# Patient Record
Sex: Female | Born: 1960 | Race: White | Hispanic: No | Marital: Single | Smoking: Current every day smoker
Health system: Southern US, Community
[De-identification: ages and names within clinical notes are randomized; demographics above are authoritative.]

## PROBLEM LIST (undated history)

## (undated) DIAGNOSIS — F17200 Nicotine dependence, unspecified, uncomplicated: Secondary | ICD-10-CM

## (undated) DIAGNOSIS — F319 Bipolar disorder, unspecified: Secondary | ICD-10-CM

## (undated) DIAGNOSIS — F419 Anxiety disorder, unspecified: Secondary | ICD-10-CM

## (undated) DIAGNOSIS — I10 Essential (primary) hypertension: Secondary | ICD-10-CM

## (undated) DIAGNOSIS — J449 Chronic obstructive pulmonary disease, unspecified: Secondary | ICD-10-CM

## (undated) HISTORY — PX: FRACTURE SURGERY: SHX138

## (undated) HISTORY — PX: CERVICAL SPINE SURGERY: SHX589

## (undated) HISTORY — PX: CARPAL TUNNEL RELEASE: SHX101

---

## 2016-08-01 ENCOUNTER — Encounter (HOSPITAL_COMMUNITY): Payer: Self-pay

## 2016-08-01 ENCOUNTER — Emergency Department (HOSPITAL_COMMUNITY)
Admission: EM | Admit: 2016-08-01 | Discharge: 2016-08-01 | Disposition: A | Discharge status: Home / Self Care | Payer: Medicare Other | Attending: Emergency Medicine | Admitting: Emergency Medicine

## 2016-08-01 ENCOUNTER — Emergency Department (HOSPITAL_COMMUNITY): Payer: Medicare Other

## 2016-08-01 DIAGNOSIS — Y9301 Activity, walking, marching and hiking: Secondary | ICD-10-CM | POA: Insufficient documentation

## 2016-08-01 DIAGNOSIS — S8991XA Unspecified injury of right lower leg, initial encounter: Secondary | ICD-10-CM | POA: Diagnosis present

## 2016-08-01 DIAGNOSIS — F17200 Nicotine dependence, unspecified, uncomplicated: Secondary | ICD-10-CM | POA: Diagnosis not present

## 2016-08-01 DIAGNOSIS — Y9289 Other specified places as the place of occurrence of the external cause: Secondary | ICD-10-CM | POA: Insufficient documentation

## 2016-08-01 DIAGNOSIS — Y999 Unspecified external cause status: Secondary | ICD-10-CM | POA: Insufficient documentation

## 2016-08-01 DIAGNOSIS — S8001XA Contusion of right knee, initial encounter: Secondary | ICD-10-CM

## 2016-08-01 DIAGNOSIS — W01198A Fall on same level from slipping, tripping and stumbling with subsequent striking against other object, initial encounter: Secondary | ICD-10-CM | POA: Insufficient documentation

## 2016-08-01 DIAGNOSIS — I10 Essential (primary) hypertension: Secondary | ICD-10-CM | POA: Insufficient documentation

## 2016-08-01 HISTORY — DX: Essential (primary) hypertension: I10

## 2016-08-01 NOTE — ED Triage Notes (Signed)
Patient complains of right leg pain after falling into metal pot last night, no loc. Complains of severe pain with ambulation. Patient with low grade fever and HR 130

## 2016-08-01 NOTE — ED Notes (Signed)
Patient advised she had been drinking coffee on the way to the hospital and during triage assessment. RN recheck temp 99.0 MD Ayesha Rumpf made aware patient had been drinking coffee. Advised to  D/c all orders but knee X-ray.

## 2016-08-01 NOTE — ED Notes (Signed)
Patient family given a coke to drink.

## 2016-08-01 NOTE — ED Provider Notes (Signed)
[facility] DEPT Provider Note   CSN: [id number] Arrival date & time: 08/01/16  1106     History   Chief Complaint Chief Complaint  Patient presents with  . Knee Injury    HPI Stacey Werner is a 55 y.o. female.  The history is provided by the patient. No language interpreter was used.   Stacey Werner is a 55 y.o. female who presents to the Emergency Department complaining of knee pain.  Last night she was walking in the dark by a campfire drinking beer when she tripped and fell, striking her right knee on a concert pot. She reports pain of the right knee and difficulty ambulating secondary to pain. She denies any chest pain, shortness breath, dysuria, fevers, nausea, vomiting, malaise. She was recently admitted to [facility] for stress testing a week ago.  Past Medical History:  Diagnosis Date  . Hypertension     There are no active problems to display for this patient.   History reviewed. No pertinent surgical history.  OB History    No data available       Home Medications    Prior to Admission medications   Medication Sig Start Date End Date Taking? Authorizing Provider  albuterol (PROVENTIL HFA;VENTOLIN HFA) 108 (90 Base) MCG/ACT inhaler Inhale 2 puffs into the lungs every 4 (four) hours as needed for wheezing or shortness of breath.    Yes Historical Provider, MD  ALPRAZolam Duanne Moron) 1 MG tablet Take 1 mg by mouth daily as needed for anxiety.   Yes Historical Provider, MD  busPIRone (BUSPAR) 15 MG tablet Take 15 mg by mouth 3 (three) times daily.   Yes Historical Provider, MD  DULoxetine (CYMBALTA) 60 MG capsule Take 60 mg by mouth daily. 05/13/16  Yes Historical Provider, MD  ergocalciferol (VITAMIN D2) 50000 units capsule Take 50,000 Units by mouth every Monday.   Yes Historical Provider, MD  gabapentin (NEURONTIN) 300 MG capsule Take 300 mg by mouth 2 (two) times daily.   Yes Historical Provider, MD  lisinopril (PRINIVIL,ZESTRIL) 10 MG tablet Take 10 mg by mouth  daily.   Yes Historical Provider, MD  methocarbamol (ROBAXIN) 750 MG tablet Take 750 mg by mouth 2 (two) times daily.   Yes Historical Provider, MD    Family History No family history on file.  Social History Social History  Substance Use Topics  . Smoking status: Current Every Day Smoker  . Smokeless tobacco: Never Used  . Alcohol use Not on file     Allergies   Review of patient's allergies indicates no known allergies.   Review of Systems Review of Systems  All other systems reviewed and are negative.    Physical Exam Updated Vital Signs BP 102/79   Pulse 110   Temp 99 F (37.2 C) (Oral)   Resp 20   SpO2 98%   Physical Exam  Constitutional: She is oriented to person, place, and time. She appears well-developed and well-nourished.  HENT:  Head: Normocephalic and atraumatic.  Cardiovascular: Regular rhythm.   No murmur heard. Tachycardic  Pulmonary/Chest: Effort normal and breath sounds normal. No respiratory distress.  Abdominal: Soft. There is no tenderness. There is no rebound and no guarding.  Musculoskeletal:  Small area of erythema to the right medial aspect of the knee. There is no significant edema. There is mild tenderness to the medial right knee. Flexion/extension are intact in the knee. 2+ DP pulses.  Neurological: She is alert and oriented to person, place, and time.  Skin: Skin  is warm and dry.  Psychiatric: Her behavior is normal.  Anxious  Nursing note and vitals reviewed.    ED Treatments / Results  Labs (all labs ordered are listed, but only abnormal results are displayed) Labs Reviewed - No data to display  EKG  EKG Interpretation None       Radiology Dg Knee Complete 4 Views Right  Result Date: 08/01/2016 CLINICAL DATA:  Fall with medial right knee pain. Initial encounter. EXAM: RIGHT KNEE - COMPLETE 4+ VIEW COMPARISON:  None. FINDINGS: No evidence of fracture, dislocation, or joint effusion. No evidence of arthropathy or other  focal bone abnormality. Soft tissues are unremarkable. IMPRESSION: Negative. Electronically Signed   By: Aletta Edouard M.D.   On: 08/01/2016 12:07    Procedures Procedures (including critical care time)  Medications Ordered in ED Medications - No data to display   Initial Impression / Assessment and Plan / ED Course  I have reviewed the triage vital signs and the nursing notes.  Pertinent labs & imaging results that were available during my care of the patient were reviewed by me and considered in my medical decision making (see chart for details).  Clinical Course    Patient here for evaluation of injuries following a mechanical fall when she was drinking last night. Her triage temperature was 100.3 with tachycardia. She has been drinking coffee drinker triage process on repeat assessment she is noted to not be febrile with temperature 99 and she has no infectious symptoms at this time. In terms of knee pain she has local tenderness with a small patch of erythema that is consistent with contusion and no evidence of acute infectious process at this time. Plain films are negative for fracture dislocation. She is well perfused. Discussed with patient home care for knee contusion with crutches for weightbearing as tolerated, Tylenol or ibuprofen for pain, outpatient follow-up and return precautions.  Final Clinical Impressions(s) / ED Diagnoses   Final diagnoses:  Knee contusion, right, initial encounter    New Prescriptions Discharge Medication List as of 08/01/2016 12:35 PM       Quintella Reichert, MD 08/01/16 [phone]

## 2017-10-16 ENCOUNTER — Ambulatory Visit: Payer: Self-pay | Admitting: Surgery
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
## 2017-10-24 ENCOUNTER — Other Ambulatory Visit: Payer: Self-pay

## 2017-10-24 ENCOUNTER — Encounter (HOSPITAL_BASED_OUTPATIENT_CLINIC_OR_DEPARTMENT_OTHER): Payer: Self-pay | Admitting: *Deleted

## 2017-10-25 ENCOUNTER — Encounter (HOSPITAL_BASED_OUTPATIENT_CLINIC_OR_DEPARTMENT_OTHER)
Admission: RE | Admit: 2017-10-25 | Discharge: 2017-10-25 | Disposition: A | Discharge status: Home / Self Care | Payer: Medicare Other | Source: Ambulatory Visit | Attending: Surgery | Admitting: Surgery

## 2017-10-25 DIAGNOSIS — Z881 Allergy status to other antibiotic agents status: Secondary | ICD-10-CM | POA: Diagnosis not present

## 2017-10-25 DIAGNOSIS — Z01812 Encounter for preprocedural laboratory examination: Secondary | ICD-10-CM | POA: Diagnosis present

## 2017-10-25 DIAGNOSIS — I10 Essential (primary) hypertension: Secondary | ICD-10-CM | POA: Diagnosis not present

## 2017-10-25 DIAGNOSIS — Z79899 Other long term (current) drug therapy: Secondary | ICD-10-CM | POA: Diagnosis not present

## 2017-10-25 DIAGNOSIS — D1720 Benign lipomatous neoplasm of skin and subcutaneous tissue of unspecified limb: Secondary | ICD-10-CM | POA: Diagnosis not present

## 2017-10-25 DIAGNOSIS — D1722 Benign lipomatous neoplasm of skin and subcutaneous tissue of left arm: Secondary | ICD-10-CM | POA: Diagnosis present

## 2017-10-25 DIAGNOSIS — J449 Chronic obstructive pulmonary disease, unspecified: Secondary | ICD-10-CM | POA: Diagnosis not present

## 2017-10-25 DIAGNOSIS — F419 Anxiety disorder, unspecified: Secondary | ICD-10-CM | POA: Diagnosis not present

## 2017-10-25 DIAGNOSIS — F319 Bipolar disorder, unspecified: Secondary | ICD-10-CM | POA: Diagnosis not present

## 2017-10-25 DIAGNOSIS — F17200 Nicotine dependence, unspecified, uncomplicated: Secondary | ICD-10-CM | POA: Diagnosis not present

## 2017-10-25 LAB — COMPREHENSIVE METABOLIC PANEL
ALBUMIN: 4 g/dL (ref 3.5–5.0)
ALT: 15 U/L (ref 14–54)
ANION GAP: 9 (ref 5–15)
AST: 24 U/L (ref 15–41)
Alkaline Phosphatase: 96 U/L (ref 38–126)
BUN: 8 mg/dL (ref 6–20)
CALCIUM: 9.1 mg/dL (ref 8.9–10.3)
CO2: 25 mmol/L (ref 22–32)
Chloride: 107 mmol/L (ref 101–111)
Creatinine, Ser: 0.79 mg/dL (ref 0.44–1.00)
GFR calc Af Amer: >60 mL/min (ref >60)
Glucose, Bld: 105 mg/dL — ABNORMAL HIGH (ref 65–99)
Potassium: 4 mmol/L (ref 3.5–5.1)
SODIUM: 141 mmol/L (ref 135–145)
TOTAL PROTEIN: 6.6 g/dL (ref 6.5–8.1)
Total Bilirubin: 0.5 mg/dL (ref 0.3–1.2)

## 2017-10-25 LAB — CBC WITH DIFFERENTIAL/PLATELET
BASOS ABS: 0.1 10*3/uL (ref 0.0–0.1)
BASOS PCT: 1 %
EOS ABS: 0.3 10*3/uL (ref 0.0–0.7)
EOS PCT: 5 %
HCT: 38.5 % (ref 36.0–46.0)
Hemoglobin: 12.7 g/dL (ref 12.0–15.0)
LYMPHS PCT: 34 %
Lymphs Abs: 2.6 10*3/uL (ref 0.7–4.0)
MCH: 27.2 pg (ref 26.0–34.0)
MCHC: 33 g/dL (ref 30.0–36.0)
MCV: 82.4 fL (ref 78.0–100.0)
MONO ABS: 0.6 10*3/uL (ref 0.1–1.0)
Monocytes Relative: 8 %
Neutro Abs: 4 10*3/uL (ref 1.7–7.7)
Neutrophils Relative %: 52 %
PLATELETS: 348 10*3/uL (ref 150–400)
RBC: 4.67 MIL/uL (ref 3.87–5.11)
RDW: 14.1 % (ref 11.5–15.5)
WBC: 7.6 10*3/uL (ref 4.0–10.5)

## 2017-10-25 NOTE — Progress Notes (Signed)
Ensure pre surgery drink given with instructions to complete by [facility], pt verbalized understanding.

## 2017-10-26 ENCOUNTER — Encounter (HOSPITAL_BASED_OUTPATIENT_CLINIC_OR_DEPARTMENT_OTHER): Payer: Self-pay | Admitting: Anesthesiology

## 2017-10-26 ENCOUNTER — Ambulatory Visit (HOSPITAL_BASED_OUTPATIENT_CLINIC_OR_DEPARTMENT_OTHER): Payer: Medicare Other | Admitting: Anesthesiology

## 2017-10-26 ENCOUNTER — Encounter (HOSPITAL_BASED_OUTPATIENT_CLINIC_OR_DEPARTMENT_OTHER)
Admission: RE | Disposition: A | Discharge status: Home / Self Care | Payer: Self-pay | Source: Ambulatory Visit | Attending: Surgery

## 2017-10-26 ENCOUNTER — Ambulatory Visit (HOSPITAL_BASED_OUTPATIENT_CLINIC_OR_DEPARTMENT_OTHER)
Admission: RE | Admit: 2017-10-26 | Discharge: 2017-10-26 | Disposition: A | Discharge status: Home / Self Care | Payer: Medicare Other | Source: Ambulatory Visit | Attending: Surgery | Admitting: Surgery

## 2017-10-26 DIAGNOSIS — F17200 Nicotine dependence, unspecified, uncomplicated: Secondary | ICD-10-CM | POA: Insufficient documentation

## 2017-10-26 DIAGNOSIS — Z881 Allergy status to other antibiotic agents status: Secondary | ICD-10-CM | POA: Insufficient documentation

## 2017-10-26 DIAGNOSIS — Z79899 Other long term (current) drug therapy: Secondary | ICD-10-CM | POA: Insufficient documentation

## 2017-10-26 DIAGNOSIS — D1722 Benign lipomatous neoplasm of skin and subcutaneous tissue of left arm: Secondary | ICD-10-CM | POA: Diagnosis not present

## 2017-10-26 DIAGNOSIS — I10 Essential (primary) hypertension: Secondary | ICD-10-CM | POA: Diagnosis not present

## 2017-10-26 DIAGNOSIS — F319 Bipolar disorder, unspecified: Secondary | ICD-10-CM | POA: Insufficient documentation

## 2017-10-26 DIAGNOSIS — J449 Chronic obstructive pulmonary disease, unspecified: Secondary | ICD-10-CM | POA: Insufficient documentation

## 2017-10-26 DIAGNOSIS — F419 Anxiety disorder, unspecified: Secondary | ICD-10-CM | POA: Insufficient documentation

## 2017-10-26 HISTORY — DX: Chronic obstructive pulmonary disease, unspecified: J44.9

## 2017-10-26 HISTORY — DX: Bipolar disorder, unspecified: F31.9

## 2017-10-26 HISTORY — PX: MASS EXCISION: SHX2000

## 2017-10-26 HISTORY — DX: Nicotine dependence, unspecified, uncomplicated: F17.200

## 2017-10-26 HISTORY — DX: Anxiety disorder, unspecified: F41.9

## 2017-10-26 SURGERY — EXCISION MASS
Anesthesia: General | Site: Shoulder | Laterality: Left

## 2017-10-26 MED ORDER — ACETAMINOPHEN 500 MG PO TABS: 1000.0000 mg | ORAL_TABLET | ORAL | Status: DC

## 2017-10-26 MED ORDER — LACTATED RINGERS IV SOLN
INTRAVENOUS | Status: DC
  Administered 2017-10-26: 14:00:00 via INTRAVENOUS

## 2017-10-26 MED ORDER — PROPOFOL 10 MG/ML IV BOLUS
INTRAVENOUS | Status: DC | PRN
  Administered 2017-10-26: 140 mg via INTRAVENOUS

## 2017-10-26 MED ORDER — DEXAMETHASONE SODIUM PHOSPHATE 10 MG/ML IJ SOLN
INTRAMUSCULAR | Status: AC
  Filled 2017-10-26: qty 1

## 2017-10-26 MED ORDER — ACETAMINOPHEN 500 MG PO TABS
ORAL_TABLET | ORAL | Status: AC
  Filled 2017-10-26: qty 2

## 2017-10-26 MED ORDER — FENTANYL CITRATE (PF) 100 MCG/2ML IJ SOLN
25.0000 ug | INTRAMUSCULAR | Status: DC | PRN
  Administered 2017-10-26: 50 ug via INTRAVENOUS

## 2017-10-26 MED ORDER — SCOPOLAMINE 1 MG/3DAYS TD PT72: 1.0000 | MEDICATED_PATCH | Freq: Once | TRANSDERMAL | Status: DC | PRN

## 2017-10-26 MED ORDER — IBUPROFEN 800 MG PO TABS
800.0000 mg | ORAL_TABLET | Freq: Three times a day (TID) | ORAL | 0 refills | Status: AC | PRN
Start: 2017-10-26 — End: ?

## 2017-10-26 MED ORDER — LIDOCAINE 2% (20 MG/ML) 5 ML SYRINGE
INTRAMUSCULAR | Status: DC | PRN
  Administered 2017-10-26: 50 mg via INTRAVENOUS

## 2017-10-26 MED ORDER — CHLORHEXIDINE GLUCONATE CLOTH 2 % EX PADS: 6.0000 | MEDICATED_PAD | Freq: Once | CUTANEOUS | Status: DC

## 2017-10-26 MED ORDER — ACETAMINOPHEN 500 MG PO TABS
1000.0000 mg | ORAL_TABLET | Freq: Once | ORAL | Status: AC
  Administered 2017-10-26: 1000 mg via ORAL

## 2017-10-26 MED ORDER — CELECOXIB 200 MG PO CAPS
200.0000 mg | ORAL_CAPSULE | ORAL | Status: AC
  Administered 2017-10-26: 200 mg via ORAL

## 2017-10-26 MED ORDER — ONDANSETRON HCL 4 MG/2ML IJ SOLN
INTRAMUSCULAR | Status: AC
  Filled 2017-10-26: qty 2

## 2017-10-26 MED ORDER — PHENYLEPHRINE HCL 10 MG/ML IJ SOLN
INTRAMUSCULAR | Status: DC | PRN
  Administered 2017-10-26 (×3): 40 ug via INTRAVENOUS

## 2017-10-26 MED ORDER — FENTANYL CITRATE (PF) 100 MCG/2ML IJ SOLN
INTRAMUSCULAR | Status: AC
  Filled 2017-10-26: qty 2

## 2017-10-26 MED ORDER — HYDROCODONE-ACETAMINOPHEN 5-325 MG PO TABS
1.0000 | ORAL_TABLET | Freq: Four times a day (QID) | ORAL | 0 refills | Status: AC | PRN
Start: 2017-10-26 — End: ?

## 2017-10-26 MED ORDER — MIDAZOLAM HCL 2 MG/2ML IJ SOLN
1.0000 mg | INTRAMUSCULAR | Status: DC | PRN
  Administered 2017-10-26: 2 mg via INTRAVENOUS

## 2017-10-26 MED ORDER — ONDANSETRON HCL 4 MG/2ML IJ SOLN: 4.0000 mg | Freq: Once | INTRAMUSCULAR | Status: DC | PRN

## 2017-10-26 MED ORDER — DEXTROSE 5 % IV SOLN
3.0000 g | INTRAVENOUS | Status: AC
  Administered 2017-10-26: 2 g via INTRAVENOUS

## 2017-10-26 MED ORDER — CEFAZOLIN SODIUM-DEXTROSE 2-4 GM/100ML-% IV SOLN
INTRAVENOUS | Status: AC
  Filled 2017-10-26: qty 100

## 2017-10-26 MED ORDER — PHENYLEPHRINE 40 MCG/ML (10ML) SYRINGE FOR IV PUSH (FOR BLOOD PRESSURE SUPPORT)
PREFILLED_SYRINGE | INTRAVENOUS | Status: AC
  Filled 2017-10-26: qty 10

## 2017-10-26 MED ORDER — FENTANYL CITRATE (PF) 100 MCG/2ML IJ SOLN
50.0000 ug | INTRAMUSCULAR | Status: DC | PRN
  Administered 2017-10-26: 100 ug via INTRAVENOUS

## 2017-10-26 MED ORDER — BUPIVACAINE-EPINEPHRINE 0.25% -1:200000 IJ SOLN
INTRAMUSCULAR | Status: DC | PRN
  Administered 2017-10-26: 3.5 mL

## 2017-10-26 MED ORDER — ONDANSETRON HCL 4 MG/2ML IJ SOLN
INTRAMUSCULAR | Status: DC | PRN
  Administered 2017-10-26: 4 mg via INTRAVENOUS

## 2017-10-26 MED ORDER — CELECOXIB 200 MG PO CAPS
ORAL_CAPSULE | ORAL | Status: AC
  Filled 2017-10-26: qty 1

## 2017-10-26 MED ORDER — MIDAZOLAM HCL 2 MG/2ML IJ SOLN
INTRAMUSCULAR | Status: AC
Start: 2017-10-26 — End: 2017-10-26
  Filled 2017-10-26: qty 2

## 2017-10-26 MED ORDER — DEXAMETHASONE SODIUM PHOSPHATE 4 MG/ML IJ SOLN
INTRAMUSCULAR | Status: DC | PRN
  Administered 2017-10-26: 10 mg via INTRAVENOUS

## 2017-10-26 MED ORDER — LIDOCAINE 2% (20 MG/ML) 5 ML SYRINGE
INTRAMUSCULAR | Status: AC
  Filled 2017-10-26: qty 5

## 2017-10-26 SURGICAL SUPPLY — 43 items
BENZOIN TINCTURE PRP APPL 2/3 (GAUZE/BANDAGES/DRESSINGS) IMPLANT
BLADE SURG 10 STRL SS (BLADE) IMPLANT
BLADE SURG 15 STRL LF DISP TIS (BLADE) ×1 IMPLANT
BLADE SURG 15 STRL SS (BLADE) ×2
CANISTER SUCT 1200ML W/VALVE (MISCELLANEOUS) IMPLANT
CHLORAPREP W/TINT 26ML (MISCELLANEOUS) ×3 IMPLANT
CLOSURE WOUND 1/2 X4 (GAUZE/BANDAGES/DRESSINGS)
COVER BACK TABLE 60X90IN (DRAPES) ×3 IMPLANT
COVER MAYO STAND STRL (DRAPES) ×3 IMPLANT
DECANTER SPIKE VIAL GLASS SM (MISCELLANEOUS) IMPLANT
DERMABOND ADVANCED (GAUZE/BANDAGES/DRESSINGS) ×2
DERMABOND ADVANCED .7 DNX12 (GAUZE/BANDAGES/DRESSINGS) ×1 IMPLANT
DRAPE LAPAROTOMY 100X72 PEDS (DRAPES) ×3 IMPLANT
DRAPE UTILITY XL STRL (DRAPES) ×3 IMPLANT
ELECT COATED BLADE 2.86 ST (ELECTRODE) ×3 IMPLANT
ELECT REM PT RETURN 9FT ADLT (ELECTROSURGICAL) ×3
ELECTRODE REM PT RTRN 9FT ADLT (ELECTROSURGICAL) ×1 IMPLANT
GLOVE BIO SURGEON STRL SZ 6.5 (GLOVE) ×2 IMPLANT
GLOVE BIO SURGEONS STRL SZ 6.5 (GLOVE) ×1
GLOVE BIOGEL PI IND STRL 7.0 (GLOVE) ×1 IMPLANT
GLOVE BIOGEL PI IND STRL 8 (GLOVE) ×1 IMPLANT
GLOVE BIOGEL PI INDICATOR 7.0 (GLOVE) ×2
GLOVE BIOGEL PI INDICATOR 8 (GLOVE) ×2
GLOVE ECLIPSE 8.0 STRL XLNG CF (GLOVE) ×3 IMPLANT
GOWN STRL REUS W/ TWL LRG LVL3 (GOWN DISPOSABLE) ×2 IMPLANT
GOWN STRL REUS W/TWL LRG LVL3 (GOWN DISPOSABLE) ×4
NEEDLE HYPO 25X1 1.5 SAFETY (NEEDLE) ×3 IMPLANT
NS IRRIG 1000ML POUR BTL (IV SOLUTION) IMPLANT
PACK BASIN DAY SURGERY FS (CUSTOM PROCEDURE TRAY) ×3 IMPLANT
PENCIL BUTTON HOLSTER BLD 10FT (ELECTRODE) ×3 IMPLANT
SLEEVE SCD COMPRESS KNEE MED (MISCELLANEOUS) ×3 IMPLANT
SPONGE LAP 4X18 X RAY DECT (DISPOSABLE) ×3 IMPLANT
STAPLER VISISTAT 35W (STAPLE) IMPLANT
STRIP CLOSURE SKIN 1/2X4 (GAUZE/BANDAGES/DRESSINGS) IMPLANT
SUT MON AB 4-0 PC3 18 (SUTURE) ×3 IMPLANT
SUT VICRYL 3-0 CR8 SH (SUTURE) ×3 IMPLANT
SUT VICRYL AB 3 0 TIES (SUTURE) IMPLANT
SYR CONTROL 10ML LL (SYRINGE) ×3 IMPLANT
TOWEL OR 17X24 6PK STRL BLUE (TOWEL DISPOSABLE) ×3 IMPLANT
TOWEL OR NON WOVEN STRL DISP B (DISPOSABLE) ×3 IMPLANT
TUBE CONNECTING 20'X1/4 (TUBING)
TUBE CONNECTING 20X1/4 (TUBING) IMPLANT
YANKAUER SUCT BULB TIP NO VENT (SUCTIONS) IMPLANT

## 2017-10-26 NOTE — Op Note (Signed)
Preoperative diagnosis: Lipoma left shoulder 5 cm subcutaneous  Postoperative diagnosis: Same  Procedure: Excision of left shoulder lipoma  Surgeon: Erroll Luna MD  Anesthesia: LMA with local  EBL: 20 cc  Drains: None  Specimen: Lipoma to pathology  IV fluids: Per anesthesia record  Indications for procedure: Patient presents for excision of symptomatic lipoma from her left anterior shoulder.  2 and present for many years and getting larger.  We discussed observation versus excision.  Risk, benefits and other surgical and medical options discussed.The procedure has been discussed with the patient.  Alternative therapies have been discussed with the patient.  Operative risks include bleeding,  Infection,  Organ injury,  Nerve injury,  Blood vessel injury,  DVT,  Pulmonary embolism,  Death,  And possible reoperation.  Medical management risks include worsening of present situation.  The success of the procedure is 50 -90 % at treating patients symptoms.  The patient understands and agrees to proceed.  Description of procedure: The patient was met in the holding area.  The site was marked with the assistance of the patient.  Questions were answered.  The patient was taken back to the operating room.  She was placed upon the operative table.  She was placed supine.  Her left shoulder was elevated under a bump.  The site was prepped and draped in a sterile fashion timeout was done.  She received preoperative antibiotics.  Transverse incision was made over the mass.  The mass was subcutaneous and multiloculated.  All loculations were removed.  This was superficial to the musculature.  The wound was hemostatic.  It was then closed with a deep layer of 3-0 Vicryl and 4-0 Monocryl sub-subcuticular stitch.  All final counts were found to be correct.  Dermabond applied.  The patient was awoke extubated taken recovery in satisfactory condition.

## 2017-10-26 NOTE — Interval H&P Note (Signed)
History and Physical Interval Note:  10/26/2017 1:45 PM  Stacey Werner  has presented today for surgery, with the diagnosis of LIPOMA LEFT SHOULDER  The various methods of treatment have been discussed with the patient and family. After consideration of risks, benefits and other options for treatment, the patient has consented to  Procedure(s): EXCISION LIPOMA LEFT SHOULDER (Left) as a surgical intervention .  The patient's history has been reviewed, patient examined, no change in status, stable for surgery.  I have reviewed the patient's chart and labs.  Questions were answered to the patient's satisfaction.     [place]

## 2017-10-26 NOTE — Transfer of Care (Signed)
Immediate Anesthesia Transfer of Care Note  Patient: Stacey Werner  Procedure(s) Performed: EXCISION LIPOMA LEFT SHOULDER (Left Shoulder)  Patient Location: PACU  Anesthesia Type:General  Level of Consciousness: awake and sedated  Airway & Oxygen Therapy: Patient Spontanous Breathing and Patient connected to face mask oxygen  Post-op Assessment: Report given to RN and Post -op Vital signs reviewed and stable  Post vital signs: Reviewed and stable  Last Vitals:  Vitals:   10/26/17 1411  BP: (!) 133/92  Pulse: 100  Temp: 37 C  SpO2: 100%    Last Pain:  Vitals:   10/26/17 1411  TempSrc: Oral  PainSc: 8          Complications: No apparent anesthesia complications

## 2017-10-26 NOTE — Anesthesia Postprocedure Evaluation (Signed)
Anesthesia Post Note  Patient: Ashlynd Michna  Procedure(s) Performed: EXCISION LIPOMA LEFT SHOULDER (Left Shoulder)     Patient location during evaluation: PACU Anesthesia Type: General Level of consciousness: awake and alert Pain management: pain level controlled Vital Signs Assessment: post-procedure vital signs reviewed and stable Respiratory status: spontaneous breathing, nonlabored ventilation and respiratory function stable Cardiovascular status: blood pressure returned to baseline and stable Postop Assessment: no apparent nausea or vomiting Anesthetic complications: no    Last Vitals:  Vitals:   10/26/17 1411 10/26/17 1457  BP: (!) 133/92   Pulse: 100   Temp: 37 C 36.5 C  SpO2: 100%     Last Pain:  Vitals:   10/26/17 1411  TempSrc: Oral  PainSc: 8                  Catalina Gravel

## 2017-10-26 NOTE — Discharge Instructions (Signed)
Tyleonl 1000 mg given at 204pm     GENERAL SURGERY: POST OP INSTRUCTIONS  ######################################################################  EAT Gradually transition to a high fiber diet with a fiber supplement over the next few weeks after discharge.  Start with a pureed / full liquid diet (see below)  WALK Walk an hour a day.  Control your pain to do that.    CONTROL PAIN Control pain so that you can walk, sleep, tolerate sneezing/coughing, go up/down stairs.  HAVE A BOWEL MOVEMENT DAILY Keep your bowels regular to avoid problems.  OK to try a laxative to override constipation.  OK to use an antidairrheal to slow down diarrhea.  Call if not better after 2 tries  CALL IF YOU HAVE PROBLEMS/CONCERNS Call if you are still struggling despite following these instructions. Call if you have concerns not answered by these instructions  ######################################################################        1. DIET: Follow a light bland diet the first 24 hours after arrival home, such as soup, liquids, crackers, etc.  Be sure to include lots of fluids daily.  Avoid fast food or heavy meals as your are more likely to get nauseated.   2. Take your usually prescribed home medications unless otherwise directed. 3. PAIN CONTROL: a. Pain is best controlled by a usual combination of three different methods TOGETHER: i. Ice/Heat ii. Over the counter pain medication iii. Prescription pain medication b. Most patients will experience some swelling and bruising around the incisions.  Ice packs or heating pads (30-60 minutes up to 6 times a day) will help. Use ice for the first few days to help decrease swelling and bruising, then switch to heat to help relax tight/sore spots and speed recovery.  Some people prefer to use ice alone, heat alone, alternating between ice & heat.  Experiment to what works for you.  Swelling and bruising can take several weeks to resolve.   c. It is helpful to  take an over-the-counter pain medication regularly for the first few weeks.  Choose one of the following that works best for you: i. Naproxen (Aleve, etc)  Two 220mg  tabs twice a day ii. Ibuprofen (Advil, etc) Three 200mg  tabs four times a day (every meal & bedtime) iii. Acetaminophen (Tylenol, etc) 500-650mg  four times a day (every meal & bedtime) d. A  prescription for pain medication (such as oxycodone, hydrocodone, etc) should be given to you upon discharge.  Take your pain medication as prescribed.  i. If you are having problems/concerns with the prescription medicine (does not control pain, nausea, vomiting, rash, itching, etc), please call us [phone] to see if we need to switch you to a different pain medicine that will work better for you and/or control your side effect better. ii. If you need a refill on your pain medication, please contact your pharmacy.  They will contact our office to request authorization. Prescriptions will not be filled after 5 pm or on week-ends. 4. Avoid getting constipated.  Between the surgery and the pain medications, it is common to experience some constipation.  Increasing fluid intake and taking a fiber supplement (such as Metamucil, Citrucel, FiberCon, MiraLax, etc) 1-2 times a day regularly will usually help prevent this problem from occurring.  A mild laxative (prune juice, Milk of Magnesia, MiraLax, etc) should be taken according to package directions if there are no bowel movements after 48 hours.   5. Wash / shower every day.  You may shower over the dressings as they are waterproof.  Continue  to shower over incision(s) after the dressing is off. 6. Remove your waterproof bandages 5 days after surgery.  You may leave the incision open to air.  You may have skin tapes (Steri Strips) covering the incision(s).  Leave them on until one week, then remove.  You may replace a dressing/Band-Aid to cover the incision for comfort if you wish.       7. ACTIVITIES as tolerated:   a. You may resume regular (light) daily activities beginning the next day--such as daily self-care, walking, climbing stairs--gradually increasing activities as tolerated.  If you can walk 30 minutes without difficulty, it is safe to try more intense activity such as jogging, treadmill, bicycling, low-impact aerobics, swimming, etc. b. Save the most intensive and strenuous activity for last such as sit-ups, heavy lifting, contact sports, etc  Refrain from any heavy lifting or straining until you are off narcotics for pain control.   c. DO NOT PUSH THROUGH PAIN.  Let pain be your guide: If it hurts to do something, don't do it.  Pain is your body warning you to avoid that activity for another week until the pain goes down. d. You may drive when you are no longer taking prescription pain medication, you can comfortably wear a seatbelt, and you can safely maneuver your car and apply brakes. e. Dennis Bast may have sexual intercourse when it is comfortable.  8. FOLLOW UP in our office a. Please call [facility] at [phone] to set up an appointment to see your surgeon in the office for a follow-up appointment approximately 2-3 weeks after your surgery. b. Make sure that you call for this appointment the day you arrive home to insure a convenient appointment time. 9. IF YOU HAVE DISABILITY OR FAMILY LEAVE FORMS, BRING THEM TO THE OFFICE FOR PROCESSING.  DO NOT GIVE THEM TO YOUR DOCTOR.   WHEN TO CALL us [phone]: 1. Poor pain control 2. Reactions / problems with new medications (rash/itching, nausea, etc)  3. Fever over 101.5 F (38.5 C) 4. Worsening swelling or bruising 5. Continued bleeding from incision. 6. Increased pain, redness, or drainage from the incision 7. Difficulty breathing / swallowing   The clinic staff is available to answer your questions during regular business hours (8:30am-5pm).  Please dont hesitate to call and ask to speak to one of our nurses  for clinical concerns.   If you have a medical emergency, go to the nearest emergency room or call 911.  A surgeon from [facility] is always on call at the [facility], [place], [place], [place], [place]  [address] ? MAIN: [phone] ? TOLL FREE: [phone] ?  FAX [phone] [email or web address]   Post Anesthesia Home Care Instructions  Activity: Get plenty of rest for the remainder of the day. A responsible individual must stay with you for 24 hours following the procedure.  For the next 24 hours, DO NOT: -Drive a car -Paediatric nurse -Drink alcoholic beverages -Take any medication unless instructed by your physician -Make any legal decisions or sign important papers.  Meals: Start with liquid foods such as gelatin or soup. Progress to regular foods as tolerated. Avoid greasy, spicy, heavy foods. If nausea and/or vomiting occur, drink only clear liquids until the nausea and/or vomiting subsides. Call your physician if vomiting continues.  Special Instructions/Symptoms: Your throat may feel dry or sore from the anesthesia or the breathing tube placed in your throat during surgery. If this causes  discomfort, gargle with warm salt water. The discomfort should disappear within 24 hours.  If you had a scopolamine patch placed behind your ear for the management of post- operative nausea and/or vomiting:  1. The medication in the patch is effective for 72 hours, after which it should be removed.  Wrap patch in a tissue and discard in the trash. Wash hands thoroughly with soap and water. 2. You may remove the patch earlier than 72 hours if you experience unpleasant side effects which may include dry mouth, dizziness or visual disturbances. 3. Avoid touching the patch. Wash your hands with soap and water after contact with the patch.

## 2017-10-26 NOTE — Anesthesia Preprocedure Evaluation (Addendum)
Anesthesia Evaluation  Patient identified by MRN, date of birth, ID band Patient awake    Reviewed: Allergy & Precautions, NPO status , Patient's Chart, lab work & pertinent test results  Airway Mallampati: II  TM Distance: <3 FB Neck ROM: Full    Dental  (+) Dental Advisory Given, Edentulous Upper, Partial Lower   Pulmonary COPD,  COPD inhaler, Current Smoker,    Pulmonary exam normal breath sounds clear to auscultation       Cardiovascular hypertension, Pt. on medications Normal cardiovascular exam Rhythm:Regular Rate:Normal     Neuro/Psych PSYCHIATRIC DISORDERS Anxiety Bipolar Disorder negative neurological ROS     GI/Hepatic negative GI ROS, Neg liver ROS,   Endo/Other  negative endocrine ROS  Renal/GU negative Renal ROS     Musculoskeletal negative musculoskeletal ROS (+)   Abdominal   Peds  Hematology negative hematology ROS (+)   Anesthesia Other Findings Day of surgery medications reviewed with the patient.  Reproductive/Obstetrics                            Anesthesia Physical Anesthesia Plan  ASA: II  Anesthesia Plan: General   Post-op Pain Management:    Induction: Intravenous  PONV Risk Score and Plan: 2 and Dexamethasone and Ondansetron  Airway Management Planned: LMA  Additional Equipment:   Intra-op Plan:   Post-operative Plan: Extubation in OR  Informed Consent: I have reviewed the patients History and Physical, chart, labs and discussed the procedure including the risks, benefits and alternatives for the proposed anesthesia with the patient or authorized representative who has indicated his/her understanding and acceptance.   Dental advisory given  Plan Discussed with: CRNA  Anesthesia Plan Comments: (Risks/benefits of general anesthesia discussed with patient including risk of damage to teeth, lips, gum, and tongue, nausea/vomiting, allergic reactions to  medications, and the possibility of heart attack, stroke and death.  All patient questions answered.  Patient wishes to proceed.)       Anesthesia Quick Evaluation

## 2017-10-26 NOTE — Anesthesia Procedure Notes (Signed)
Procedure Name: LMA Insertion Performed by: Liany Mumpower W, CRNA Pre-anesthesia Checklist: Patient identified, Emergency Drugs available, Suction available and Patient being monitored Patient Re-evaluated:Patient Re-evaluated prior to induction Oxygen Delivery Method: Circle system utilized Preoxygenation: Pre-oxygenation with 100% oxygen Induction Type: IV induction Ventilation: Mask ventilation without difficulty LMA: LMA inserted LMA Size: 4.0 Number of attempts: 1 Placement Confirmation: positive ETCO2 Tube secured with: Tape Dental Injury: Teeth and Oropharynx as per pre-operative assessment        

## 2017-10-27 ENCOUNTER — Encounter (HOSPITAL_BASED_OUTPATIENT_CLINIC_OR_DEPARTMENT_OTHER): Payer: Self-pay | Admitting: Surgery

## 2018-08-22 ENCOUNTER — Other Ambulatory Visit: Payer: Self-pay | Admitting: Gastroenterology

## 2018-08-22 DIAGNOSIS — K769 Liver disease, unspecified: Secondary | ICD-10-CM

## 2018-09-01 ENCOUNTER — Ambulatory Visit
Admission: RE | Admit: 2018-09-01 | Discharge: 2018-09-01 | Disposition: A | Discharge status: Home / Self Care | Payer: Medicare Other | Source: Ambulatory Visit | Attending: Gastroenterology | Admitting: Gastroenterology

## 2018-09-01 DIAGNOSIS — K769 Liver disease, unspecified: Secondary | ICD-10-CM

## 2018-09-01 MED ORDER — GADOBENATE DIMEGLUMINE 529 MG/ML IV SOLN
10.0000 mL | Freq: Once | INTRAVENOUS | Status: AC | PRN
  Administered 2018-09-01: 10 mL via INTRAVENOUS

## 2018-09-24 ENCOUNTER — Other Ambulatory Visit: Payer: Self-pay | Admitting: Physician Assistant

## 2018-09-24 DIAGNOSIS — R1900 Intra-abdominal and pelvic swelling, mass and lump, unspecified site: Secondary | ICD-10-CM

## 2018-09-24 DIAGNOSIS — R1033 Periumbilical pain: Secondary | ICD-10-CM

## 2018-09-24 DIAGNOSIS — R1030 Lower abdominal pain, unspecified: Secondary | ICD-10-CM

## 2018-09-28 ENCOUNTER — Ambulatory Visit
Admission: RE | Admit: 2018-09-28 | Discharge: 2018-09-28 | Disposition: A | Discharge status: Home / Self Care | Payer: Medicare Other | Source: Ambulatory Visit | Attending: Physician Assistant | Admitting: Physician Assistant

## 2018-09-28 DIAGNOSIS — R1033 Periumbilical pain: Secondary | ICD-10-CM

## 2018-09-28 DIAGNOSIS — R1030 Lower abdominal pain, unspecified: Secondary | ICD-10-CM

## 2018-09-28 DIAGNOSIS — R1900 Intra-abdominal and pelvic swelling, mass and lump, unspecified site: Secondary | ICD-10-CM

## 2018-09-28 MED ORDER — IOPAMIDOL (ISOVUE-300) INJECTION 61%
100.0000 mL | Freq: Once | INTRAVENOUS | Status: AC | PRN
  Administered 2018-09-28: 100 mL via INTRAVENOUS

## 2018-10-18 ENCOUNTER — Ambulatory Visit
Admission: RE | Admit: 2018-10-18 | Discharge: 2018-10-18 | Disposition: A | Discharge status: Home / Self Care | Payer: Medicare Other | Source: Ambulatory Visit | Attending: Gastroenterology | Admitting: Gastroenterology

## 2018-10-18 ENCOUNTER — Other Ambulatory Visit: Payer: Self-pay | Admitting: Gastroenterology

## 2018-10-18 DIAGNOSIS — R1030 Lower abdominal pain, unspecified: Secondary | ICD-10-CM

## 2019-01-28 ENCOUNTER — Other Ambulatory Visit: Payer: Self-pay | Admitting: Family Medicine

## 2019-01-28 DIAGNOSIS — Z1231 Encounter for screening mammogram for malignant neoplasm of breast: Secondary | ICD-10-CM

## 2019-10-31 ENCOUNTER — Other Ambulatory Visit: Payer: Self-pay | Admitting: Family Medicine

## 2019-10-31 ENCOUNTER — Other Ambulatory Visit: Payer: Self-pay | Admitting: Nurse Practitioner

## 2019-10-31 DIAGNOSIS — N6001 Solitary cyst of right breast: Secondary | ICD-10-CM

## 2019-10-31 DIAGNOSIS — Z122 Encounter for screening for malignant neoplasm of respiratory organs: Secondary | ICD-10-CM

## 2020-09-17 ENCOUNTER — Other Ambulatory Visit: Payer: Self-pay

## 2020-09-17 ENCOUNTER — Encounter (HOSPITAL_COMMUNITY): Payer: Self-pay

## 2020-09-17 ENCOUNTER — Emergency Department (HOSPITAL_COMMUNITY): Payer: Medicare Other

## 2020-09-17 ENCOUNTER — Emergency Department (HOSPITAL_COMMUNITY)
Admission: EM | Admit: 2020-09-17 | Discharge: 2020-09-17 | Disposition: A | Discharge status: Home / Self Care | Payer: Medicare Other | Attending: Emergency Medicine | Admitting: Emergency Medicine

## 2020-09-17 DIAGNOSIS — I10 Essential (primary) hypertension: Secondary | ICD-10-CM | POA: Insufficient documentation

## 2020-09-17 DIAGNOSIS — R0602 Shortness of breath: Secondary | ICD-10-CM | POA: Diagnosis present

## 2020-09-17 DIAGNOSIS — J441 Chronic obstructive pulmonary disease with (acute) exacerbation: Secondary | ICD-10-CM | POA: Diagnosis not present

## 2020-09-17 DIAGNOSIS — Z79899 Other long term (current) drug therapy: Secondary | ICD-10-CM | POA: Insufficient documentation

## 2020-09-17 DIAGNOSIS — F17200 Nicotine dependence, unspecified, uncomplicated: Secondary | ICD-10-CM | POA: Insufficient documentation

## 2020-09-17 DIAGNOSIS — Z7951 Long term (current) use of inhaled steroids: Secondary | ICD-10-CM | POA: Insufficient documentation

## 2020-09-17 LAB — BASIC METABOLIC PANEL
Anion gap: 10 (ref 5–15)
BUN: 14 mg/dL (ref 6–20)
CO2: 24 mmol/L (ref 22–32)
Calcium: 8.7 mg/dL — ABNORMAL LOW (ref 8.9–10.3)
Chloride: 102 mmol/L (ref 98–111)
Creatinine, Ser: 0.72 mg/dL (ref 0.44–1.00)
GFR, Estimated: >60 mL/min (ref >60)
Glucose, Bld: 163 mg/dL — ABNORMAL HIGH (ref 70–99)
Potassium: 3.5 mmol/L (ref 3.5–5.1)
Sodium: 136 mmol/L (ref 135–145)

## 2020-09-17 LAB — CBC WITH DIFFERENTIAL/PLATELET
Abs Immature Granulocytes: 0.23 10*3/uL — ABNORMAL HIGH (ref 0.00–0.07)
Basophils Absolute: 0 10*3/uL (ref 0.0–0.1)
Basophils Relative: 0 %
Eosinophils Absolute: 0 10*3/uL (ref 0.0–0.5)
Eosinophils Relative: 0 %
HCT: 33 % — ABNORMAL LOW (ref 36.0–46.0)
Hemoglobin: 10.7 g/dL — ABNORMAL LOW (ref 12.0–15.0)
Immature Granulocytes: 2 %
Lymphocytes Relative: 6 %
Lymphs Abs: 0.9 10*3/uL (ref 0.7–4.0)
MCH: 26 pg (ref 26.0–34.0)
MCHC: 32.4 g/dL (ref 30.0–36.0)
MCV: 80.1 fL (ref 80.0–100.0)
Monocytes Absolute: 0.2 10*3/uL (ref 0.1–1.0)
Monocytes Relative: 1 %
Neutro Abs: 13.5 10*3/uL — ABNORMAL HIGH (ref 1.7–7.7)
Neutrophils Relative %: 91 %
Platelets: 339 10*3/uL (ref 150–400)
RBC: 4.12 MIL/uL (ref 3.87–5.11)
RDW: 16.4 % — ABNORMAL HIGH (ref 11.5–15.5)
WBC: 14.8 10*3/uL — ABNORMAL HIGH (ref 4.0–10.5)
nRBC: 0 % (ref 0.0–0.2)

## 2020-09-17 LAB — D-DIMER, QUANTITATIVE: D-Dimer, Quant: 0.4 ug/mL-FEU (ref 0.00–0.50)

## 2020-09-17 LAB — TROPONIN I (HIGH SENSITIVITY): Troponin I (High Sensitivity): 4 ng/L (ref <18)

## 2020-09-17 MED ORDER — PREDNISONE 20 MG PO TABS
60.0000 mg | ORAL_TABLET | Freq: Once | ORAL | Status: AC
  Administered 2020-09-17: 60 mg via ORAL
  Filled 2020-09-17: qty 3

## 2020-09-17 MED ORDER — PREDNISONE 20 MG PO TABS: ORAL_TABLET | ORAL | 0 refills | Status: DC

## 2020-09-17 MED ORDER — IPRATROPIUM BROMIDE HFA 17 MCG/ACT IN AERS
2.0000 | INHALATION_SPRAY | Freq: Once | RESPIRATORY_TRACT | Status: AC
  Administered 2020-09-17: 2 via RESPIRATORY_TRACT
  Filled 2020-09-17: qty 12.9

## 2020-09-17 MED ORDER — PREDNISONE 20 MG PO TABS: ORAL_TABLET | ORAL | 0 refills | Status: AC

## 2020-09-17 MED ORDER — ALBUTEROL SULFATE HFA 108 (90 BASE) MCG/ACT IN AERS
6.0000 | INHALATION_SPRAY | Freq: Once | RESPIRATORY_TRACT | Status: AC
  Administered 2020-09-17: 6 via RESPIRATORY_TRACT
  Filled 2020-09-17: qty 6.7

## 2020-09-17 NOTE — ED Triage Notes (Signed)
Pt presents with c/o request from her MD for a chest x-ray because of a cough.

## 2020-09-17 NOTE — ED Provider Notes (Signed)
[name] Provider Note   CSN: [id number] Arrival date & time: 09/17/20  1443     History Chief Complaint  Patient presents with  . Cough    Stacey Werner is a 59 y.o. female.  59 yo F with a chief complaints of fatigue chest pain and shortness of breath.  This been going on for a few weeks now.  She been seen at her family doctor's office multiple times treated with steroids and antibiotics with transient improvement.  She has been having chest pain that is worse with coughing.  Radiates down bilateral rib margins.  Worse with coughing and deep breathing.  Denies blood in her sputum.  Is a every day smoker.  She thinks that she is going to quit after this event.  Was told about 5 years ago that she had early stages of COPD.  She denies any lower extremity edema.  Denies continued fevers.  The history is provided by the patient.  Cough Associated symptoms: shortness of breath   Associated symptoms: no chest pain, no chills, no fever, no headaches, no myalgias, no rhinorrhea and no wheezing   Illness Severity:  Moderate Onset quality:  Gradual Duration:  2 weeks Timing:  Constant Progression:  Worsening Chronicity:  New Associated symptoms: cough, fatigue and shortness of breath   Associated symptoms: no chest pain, no congestion, no fever, no headaches, no myalgias, no nausea, no rhinorrhea, no vomiting and no wheezing        Past Medical History:  Diagnosis Date  . Anxiety   . Bipolar disorder ([place])   . COPD (chronic obstructive pulmonary disease) ([place])   . Hypertension   . Smoker     There are no problems to display for this patient.   Past Surgical History:  Procedure Laterality Date  . CARPAL TUNNEL RELEASE Bilateral   . CERVICAL SPINE SURGERY    . FRACTURE SURGERY Left    arm fx as child  . MASS EXCISION Left 10/26/2017   Procedure: EXCISION LIPOMA LEFT SHOULDER;  Surgeon: Erroll Luna, MD;  Location: [facility];  Service: General;  Laterality: Left;     OB History   No obstetric history on file.     History reviewed. No pertinent family history.  Social History   Tobacco Use  . Smoking status: Current Every Day Smoker    Packs/day: 1.00  . Smokeless tobacco: Never Used  Substance Use Topics  . Alcohol use: Yes    Comment: social  . Drug use: No    Home Medications Prior to Admission medications   Medication Sig Start Date End Date Taking? Authorizing Provider  albuterol (PROVENTIL) (2.5 MG/3ML) 0.083% nebulizer solution Take 2.5 mg by nebulization every 6 (six) hours as needed for wheezing or shortness of breath.    [provider]  amitriptyline (ELAVIL) 10 MG tablet Take 10 mg by mouth at bedtime.    [provider]  budesonide-formoterol (SYMBICORT) 160-4.5 MCG/ACT inhaler Inhale 2 puffs into the lungs 2 (two) times daily.    [provider]  busPIRone (BUSPAR) 15 MG tablet Take 15 mg by mouth 3 (three) times daily.    [provider]  cyclobenzaprine (FLEXERIL) 10 MG tablet Take 10 mg by mouth 3 (three) times daily as needed for muscle spasms.    [provider]  ergocalciferol (VITAMIN D2) 50000 units capsule Take 50,000 Units by mouth every Monday.    [provider]  gabapentin (NEURONTIN) 300  MG capsule Take 400 mg by mouth 4 (four) times daily.     [provider]  HYDROcodone-acetaminophen (NORCO/VICODIN) 5-325 MG tablet Take 1-2 tablets by mouth every 6 (six) hours as needed for moderate pain. 10/26/17   Erroll Luna, MD  hydrOXYzine (ATARAX/VISTARIL) 50 MG tablet Take 50 mg by mouth 3 (three) times daily as needed.    [provider]  ibuprofen (ADVIL,MOTRIN) 800 MG tablet Take 1 tablet (800 mg total) by mouth every 8 (eight) hours as needed. 10/26/17   Cornett, Marcello Moores, MD  lisinopril (PRINIVIL,ZESTRIL) 10 MG tablet Take 10 mg by mouth daily.    [provider]  methocarbamol  (ROBAXIN) 750 MG tablet Take 750 mg by mouth 2 (two) times daily.    [provider]  predniSONE (DELTASONE) 20 MG tablet 2 tabs po daily x 4 days 09/17/20   Deno Etienne, DO  QUEtiapine (SEROQUEL) 200 MG tablet Take 200 mg by mouth at bedtime.    [provider]    Allergies    Iodinated diagnostic agents and Tetracyclines & related  Review of Systems   Review of Systems  Constitutional: Positive for fatigue. Negative for chills and fever.  HENT: Negative for congestion and rhinorrhea.   Eyes: Negative for redness and visual disturbance.  Respiratory: Positive for cough and shortness of breath. Negative for wheezing.   Cardiovascular: Negative for chest pain and palpitations.  Gastrointestinal: Negative for nausea and vomiting.  Genitourinary: Negative for dysuria and urgency.  Musculoskeletal: Negative for arthralgias and myalgias.  Skin: Negative for pallor and wound.  Neurological: Negative for dizziness and headaches.    Physical Exam Updated Vital Signs BP 130/85 (BP Location: Right Arm)   Pulse (!) 115   Temp 98.2 F (36.8 C) (Oral)   Resp 20   SpO2 96%   Physical Exam Vitals and nursing note reviewed.  Constitutional:      General: She is not in acute distress.    Appearance: She is well-developed. She is not diaphoretic.  HENT:     Head: Normocephalic and atraumatic.  Eyes:     Pupils: Pupils are equal, round, and reactive to light.  Cardiovascular:     Rate and Rhythm: Regular rhythm. Tachycardia present.     Heart sounds: No murmur heard.  No friction rub. No gallop.   Pulmonary:     Effort: Pulmonary effort is normal.     Breath sounds: Wheezing present. No rales.     Comments: Wheezes with prolonged expiratory effort in all fields. Abdominal:     General: There is no distension.     Palpations: Abdomen is soft.     Tenderness: There is no abdominal tenderness.  Musculoskeletal:        General: No tenderness.     Cervical back: Normal  range of motion and neck supple.  Skin:    General: Skin is warm and dry.  Neurological:     Mental Status: She is alert and oriented to person, place, and time.  Psychiatric:        Behavior: Behavior normal.     ED Results / Procedures / Treatments   Labs (all labs ordered are listed, but only abnormal results are displayed) Labs Reviewed  CBC WITH DIFFERENTIAL/PLATELET - Abnormal; Notable for the following components:      Result Value   WBC 14.8 (*)    Hemoglobin 10.7 (*)    HCT 33.0 (*)    RDW 16.4 (*)    Neutro Abs  13.5 (*)    Abs Immature Granulocytes 0.23 (*)    All other components within normal limits  BASIC METABOLIC PANEL - Abnormal; Notable for the following components:   Glucose, Bld 163 (*)    Calcium 8.7 (*)    All other components within normal limits  D-DIMER, QUANTITATIVE (NOT AT [facility])  TROPONIN I (HIGH SENSITIVITY)    EKG EKG Interpretation  Date/Time:  Thursday September 17 2020 16:44:49 EDT Ventricular Rate:  102 PR Interval:    QRS Duration: 82 QT Interval:  365 QTC Calculation: 476 R Axis:   62 Text Interpretation: Sinus tachycardia Prolonged PR interval Probable left atrial enlargement Left ventricular hypertrophy 12 Lead; [place]ar No old tracing to compare Confirmed by Deno Etienne [phone]) on 09/17/2020 5:49:47 PM   Radiology DG Chest Port 1 View  Result Date: 09/17/2020 CLINICAL DATA:  Shortness of breath and cough EXAM: PORTABLE CHEST 1 VIEW COMPARISON:  None. FINDINGS: Lungs are clear. Heart size and pulmonary vascularity are normal. No adenopathy. There is postoperative change in the lower cervical region. There is midthoracic dextroscoliosis. IMPRESSION: Lungs clear.  Heart size normal.  No evident adenopathy. Electronically Signed   By: Lowella Grip III M.D.   On: 09/17/2020 16:36    Procedures Procedures (including critical care time) Discussed smoking cessation with patient and was they were offerred resources to help stop.   Total time was 5 min CPT code 99406.   Medications Ordered in ED Medications  predniSONE (DELTASONE) tablet 60 mg (has no administration in time range)  albuterol (VENTOLIN HFA) 108 (90 Base) MCG/ACT inhaler 6 puff (6 puffs Inhalation Given 09/17/20 1650)  ipratropium (ATROVENT HFA) inhaler 2 puff (2 puffs Inhalation Given 09/17/20 1651)    ED Course  I have reviewed the triage vital signs and the nursing notes.  Pertinent labs & imaging results that were available during my care of the patient were reviewed by me and considered in my medical decision making (see chart for details).    MDM Rules/Calculators/A&P                          59 yo F with a chief complaints of cough and shortness of breath.  Going on for the past few weeks.  Has been seen in the clinic and given a course of steroids and antibiotics.  She was given an long-acting inhaler today.  Was told to come to the ED for lab work and imaging.  Chest x-ray viewed by me without focal infiltrate or pneumothorax.  She was significantly tachycardic on arrival.  Will obtain laboratory evaluation including D-dimer troponin.  Will give 6 puffs of albuterol and 2 of Atrovent.  Reassess.  Significant improvement of aeration on reassessments.  Patient is feeling better.  Able to ambulate without hypoxia or significant shortness of breath.  Will discharge with burst of steroids.  Albuterol and Atrovent for home.  Pulmonology follow-up.  6:13 PM:  I have discussed the diagnosis/risks/treatment options with the patient and believe the pt to be eligible for discharge home to follow-up with PCP, Pulm. We also discussed returning to the ED immediately if new or worsening sx occur. We discussed the sx which are most concerning (e.g., sudden worsening pain, fever, inability to tolerate by mouth , need to use inhaler more often than every 4 hours) that necessitate immediate return. Medications administered to the patient during their visit and any  new prescriptions provided to the patient are listed  below.  Medications given during this visit Medications  predniSONE (DELTASONE) tablet 60 mg (has no administration in time range)  albuterol (VENTOLIN HFA) 108 (90 Base) MCG/ACT inhaler 6 puff (6 puffs Inhalation Given 09/17/20 1650)  ipratropium (ATROVENT HFA) inhaler 2 puff (2 puffs Inhalation Given 09/17/20 1651)     The patient appears reasonably screen and/or stabilized for discharge and I doubt any other medical condition or other [facility] requiring further screening, evaluation, or treatment in the ED at this time prior to discharge.   Final Clinical Impression(s) / ED Diagnoses Final diagnoses:  COPD with acute exacerbation ([place])    Rx / DC Orders ED Discharge Orders         Ordered    predniSONE (DELTASONE) 20 MG tablet        09/17/20 1811           Deno Etienne, DO 09/17/20 1813

## 2020-09-17 NOTE — Discharge Instructions (Signed)
Please use the albuterol inhaler (the yellow 1) 4 to 6 puffs every 4 hours while you are awake.  He can use the other inhaler 2 puffs twice a day.  Take the steroids as prescribed.  Please return to the emergency department for worsening trouble breathing or need to use your inhaler more often than every 4 hours.

## 2020-09-28 DEATH — deceased

## 2021-08-26 IMAGING — DX DG CHEST 1V PORT
1 series · 1 of 1 positions shown · non-contrast
Comparison: None.

CLINICAL DATA: Shortness of breath and cough

EXAM:
PORTABLE CHEST 1 VIEW

[chest ap]
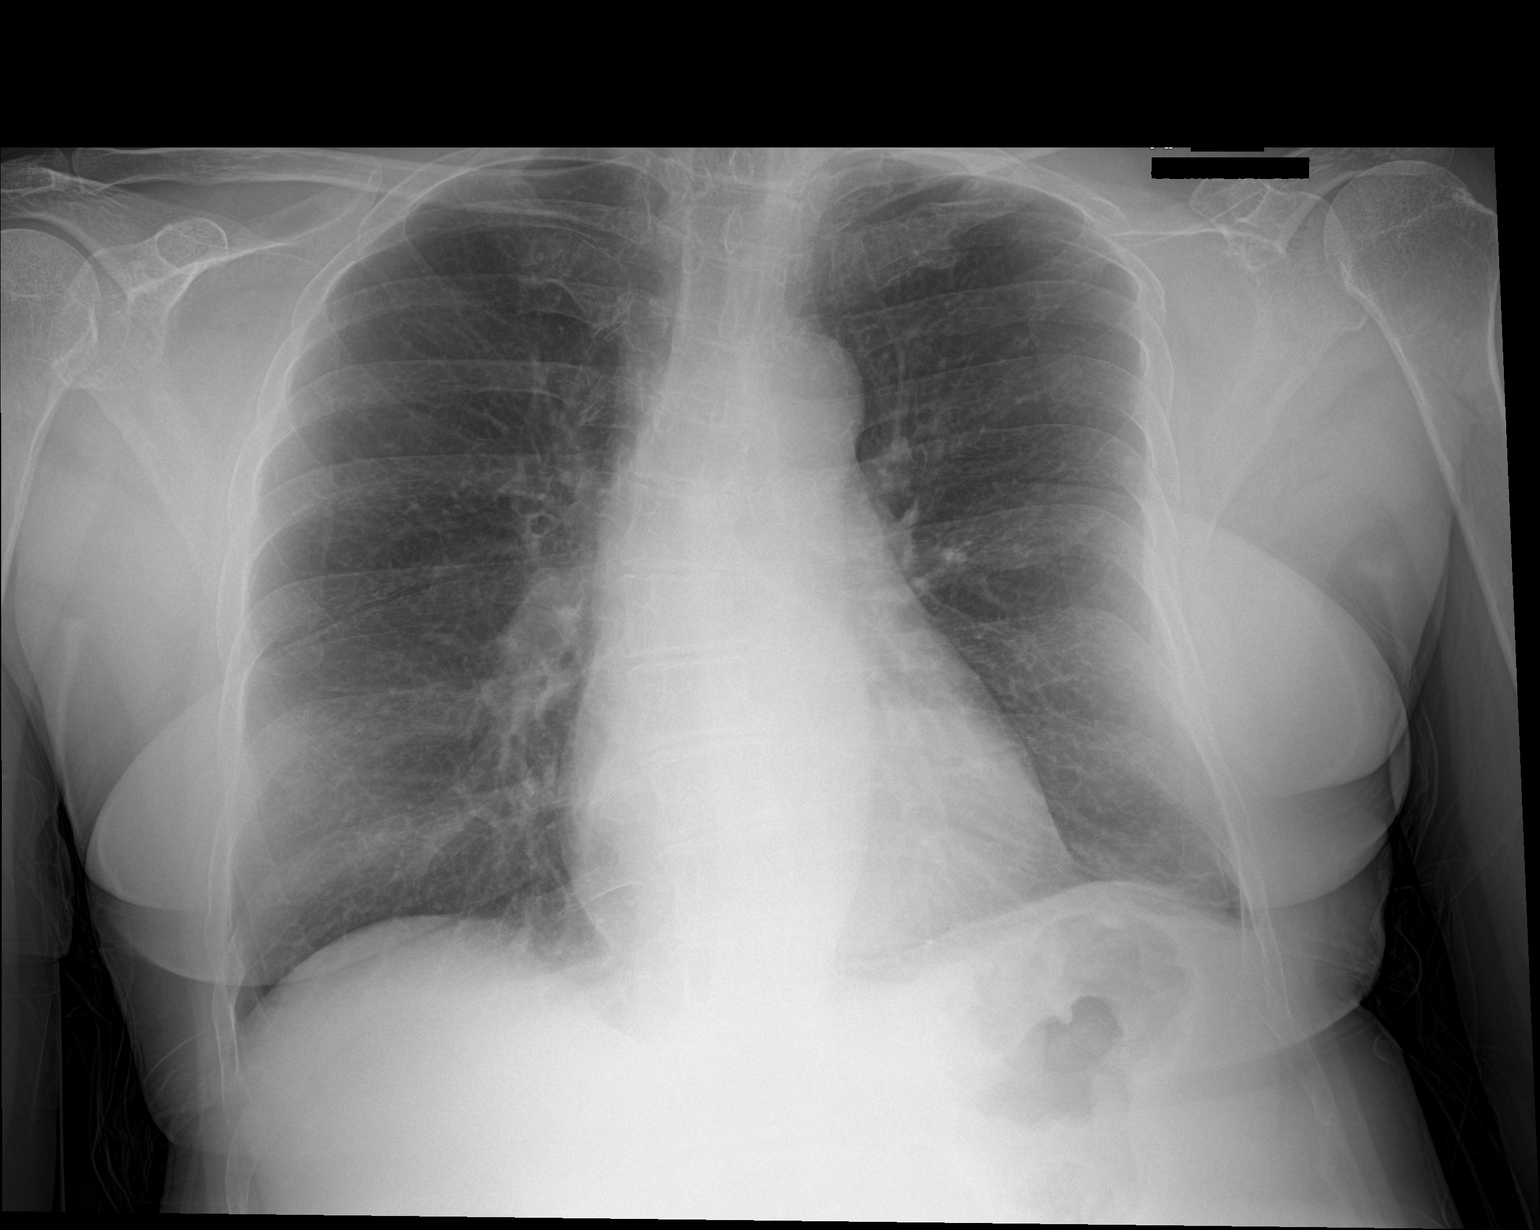

[1 of 1 positions shown; findings below may reference images not displayed]

FINDINGS: Lungs are clear. Heart size and pulmonary vascularity are normal. No
adenopathy. There is postoperative change in the lower cervical
region. There is midthoracic dextroscoliosis.
IMPRESSION: Lungs clear.  Heart size normal.  No evident adenopathy.
